# Patient Record
Sex: Male | Born: 1990 | Race: White | Hispanic: No | Marital: Single | Smoking: Never smoker
Health system: Southern US, Community
[De-identification: ages and names within clinical notes are randomized; demographics above are authoritative.]

## PROBLEM LIST (undated history)

## (undated) DIAGNOSIS — F419 Anxiety disorder, unspecified: Secondary | ICD-10-CM

## (undated) DIAGNOSIS — K219 Gastro-esophageal reflux disease without esophagitis: Secondary | ICD-10-CM

---

## 2015-03-14 ENCOUNTER — Emergency Department
Admission: EM | Admit: 2015-03-14 | Discharge: 2015-03-14 | Disposition: A | Discharge status: Home / Self Care | Payer: Worker's Compensation | Attending: Emergency Medicine | Admitting: Emergency Medicine

## 2015-03-14 ENCOUNTER — Encounter: Payer: Self-pay | Admitting: Emergency Medicine

## 2015-03-14 DIAGNOSIS — Y288XXA Contact with other sharp object, undetermined intent, initial encounter: Secondary | ICD-10-CM | POA: Diagnosis not present

## 2015-03-14 DIAGNOSIS — Y9389 Activity, other specified: Secondary | ICD-10-CM | POA: Diagnosis not present

## 2015-03-14 DIAGNOSIS — Y998 Other external cause status: Secondary | ICD-10-CM | POA: Insufficient documentation

## 2015-03-14 DIAGNOSIS — Y9289 Other specified places as the place of occurrence of the external cause: Secondary | ICD-10-CM | POA: Insufficient documentation

## 2015-03-14 DIAGNOSIS — S61011A Laceration without foreign body of right thumb without damage to nail, initial encounter: Secondary | ICD-10-CM | POA: Insufficient documentation

## 2015-03-14 NOTE — Discharge Instructions (Signed)
Laceration Care, Adult  A laceration is a cut that goes through all layers of the skin. The cut also goes into the tissue that is right under the skin. Some cuts heal on their own. Others need to be closed with stitches (sutures), staples, skin adhesive strips, or wound glue. Taking care of your cut lowers your risk of infection and helps your cut to heal better.  HOW TO TAKE CARE OF YOUR CUT  For stitches or staples:  · Keep the wound clean and dry.  · If you were given a bandage (dressing), you should change it at least one time per day or as told by your doctor. You should also change it if it gets wet or dirty.  · Keep the wound completely dry for the first 24 hours or as told by your doctor. After that time, you may take a shower or a bath. However, make sure that the wound is not soaked in water until after the stitches or staples have been removed.  · Clean the wound one time each day or as told by your doctor:    Wash the wound with soap and water.    Rinse the wound with water until all of the soap comes off.    Pat the wound dry with a clean towel. Do not rub the wound.  · After you clean the wound, put a thin layer of antibiotic ointment on it as told by your doctor. This ointment:    Helps to prevent infection.    Keeps the bandage from sticking to the wound.  · Have your stitches or staples removed as told by your doctor.  If your doctor used skin adhesive strips:   · Keep the wound clean and dry.  · If you were given a bandage, you should change it at least one time per day or as told by your doctor. You should also change it if it gets dirty or wet.  · Do not get the skin adhesive strips wet. You can take a shower or a bath, but be careful to keep the wound dry.  · If the wound gets wet, pat it dry with a clean towel. Do not rub the wound.  · Skin adhesive strips fall off on their own. You can trim the strips as the wound heals. Do not remove any strips that are still stuck to the wound. They will  fall off after a while.  If your doctor used wound glue:  · Try to keep your wound dry, but you may briefly wet it in the shower or bath. Do not soak the wound in water, such as by swimming.  · After you take a shower or a bath, gently pat the wound dry with a clean towel. Do not rub the wound.  · Do not do any activities that will make you really sweaty until the skin glue has fallen off on its own.  · Do not apply liquid, cream, or ointment medicine to your wound while the skin glue is still on.  · If you were given a bandage, you should change it at least one time per day or as told by your doctor. You should also change it if it gets dirty or wet.  · If a bandage is placed over the wound, do not let the tape for the bandage touch the skin glue.  · Do not pick at the glue. The skin glue usually stays on for 5-10 days. Then, it   or when wound glue stays in place and the wound is healed. Make sure to wear a sunscreen of at least 30 SPF.  Take over-the-counter and prescription medicines only as told by your doctor.  If you were given antibiotic medicine or ointment, take or apply it as told by your doctor. Do not stop using the antibiotic even if your wound is getting better.  Do not scratch or pick at the wound.  Keep all follow-up visits as told by your doctor. This is important.  Check your wound every day for signs of infection. Watch for:  Redness, swelling, or pain.  Fluid, blood, or pus.  Raise (elevate) the injured area above the level of your heart while you are sitting or lying down, if possible. GET HELP IF:  You got a tetanus shot and you have any of these problems at the injection site:  Swelling.  Very bad pain.  Redness.  Bleeding.  You have a fever.  A wound that was  closed breaks open.  You notice a bad smell coming from your wound or your bandage.  You notice something coming out of the wound, such as wood or glass.  Medicine does not help your pain.  You have more redness, swelling, or pain at the site of your wound.  You have fluid, blood, or pus coming from your wound.  You notice a change in the color of your skin near your wound.  You need to change the bandage often because fluid, blood, or pus is coming from the wound.  You start to have a new rash.  You start to have numbness around the wound. GET HELP RIGHT AWAY IF:  You have very bad swelling around the wound.  Your pain suddenly gets worse and is very bad.  You notice painful lumps near the wound or on skin that is anywhere on your body.  You have a red streak going away from your wound.  The wound is on your hand or foot and you cannot move a finger or toe like you usually can.  The wound is on your hand or foot and you notice that your fingers or toes look pale or bluish.   This information is not intended to replace advice given to you by your health care provider. Make sure you discuss any questions you have with your health care provider.   Document Released: 09/07/2007 Document Revised: 08/05/2014 Document Reviewed: 03/17/2014 Elsevier Interactive Patient Education 2016 [employer]  Nonsutured Laceration Care A laceration is a cut that goes through all layers of the skin and extends into the tissue that is right under the skin. This type of cut is usually stitched up (sutured) or closed with tape (adhesive strips) or skin glue shortly after the injury happens. However, if the wound is dirty or if several hours pass before medical treatment is provided, it is likely that germs (bacteria) will enter the wound. Closing a laceration after bacteria have entered it increases the risk of infection. In these cases, your health care provider may leave the laceration open  (nonsutured) and cover it with a bandage. This type of treatment helps prevent infection and allows the wound to heal from the deepest layer of tissue damage up to the surface. An open fracture is a type of injury that may involve nonsutured lacerations. An open fracture is a break in a bone that happens along with one or more lacerations through the skin that is near the fracture site. HOW  TO CARE FOR YOUR NONSUTURED LACERATION  Take or apply over-the-counter and prescription medicines only as told by your health care provider.  If you were prescribed an antibiotic medicine, take or apply it as told by your health care provider. Do not stop using the antibiotic even if your condition improves.  Clean the wound one time each day or as told by your health care provider.  Wash the wound with mild soap and water.  Rinse the wound with water to remove all soap.  Pat your wound dry with a clean towel. Do not rub the wound.  Do not inject anything into the wound unless your health care provider told you to.  Change any bandages (dressings) as told by your health care provider. This includes changing the dressing if it gets wet, dirty, or starts to smell bad.  Keep the dressing dry until your health care provider says it can be removed. Do not take baths, swim, or do anything that puts your wound underwater until your health care provider approves.  Raise (elevate) the injured area above the level of your heart while you are sitting or lying down, if possible.  Do not scratch or pick at the wound.  Check your wound every day for signs of infection. Watch for:  Redness, swelling, or pain.  Fluid, blood, or pus.  Keep all follow-up visits as told by your health care provider. This is important. SEEK MEDICAL CARE IF:  You received a tetanus and shot and you have swelling, severe pain, redness, or bleeding at the injection site.   You have a fever.  Your pain is not controlled with  medicine.  You have increased redness, swelling, or pain at the site of your wound.  You have fluid, blood, or pus coming from your wound.  You notice a bad smell coming from your wound or your dressing.  You notice something coming out of the wound, such as wood or glass.  You notice a change in the color of your skin near your wound.  You develop a new rash.  You need to change the dressing frequently due to fluid, blood, or pus draining from the wound.  You develop numbness around your wound. SEEK IMMEDIATE MEDICAL CARE IF:  Your pain suddenly increases and is severe.  You develop severe swelling around the wound.  The wound is on your hand or foot and you cannot properly move a finger or toe.  The wound is on your hand or foot and you notice that your fingers or toes look pale or bluish.  You have a red streak going away from your wound.   This information is not intended to replace advice given to you by your health care provider. Make sure you discuss any questions you have with your health care provider.   Document Released: 02/16/2006 Document Revised: 08/05/2014 Document Reviewed: 03/17/2014 Elsevier Interactive Patient Education 2016 [employer]   Keep the wound/bandage clean and dry. Wear gloves at work for protection. [facility] or your company's provider.

## 2015-03-14 NOTE — ED Notes (Signed)
Pt states lacerated left thumb on knife at 1700. Dressing in place.

## 2015-03-14 NOTE — ED Notes (Signed)
Pt discharged home after verbalizing understanding of discharge instructions; nad noted. 

## 2015-03-14 NOTE — ED Notes (Signed)
Pt presents from work with laceration to left thumb that he has glued. He states he cleaned it with alcohol pad and then glued it. Pt current on tetanus. Denies pain.

## 2015-03-14 NOTE — ED Provider Notes (Signed)
[facility] Emergency Department Provider Note ____________________________________________  Time seen: 2215  I have reviewed the triage vital signs and the nursing notes.  HISTORY  Chief Complaint  Laceration  HPI Jorge Ballard is a 24 y.o. male ports to the ED from work with a laceration to his left thumb. 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 Bleeding is currently controlled as he later washed the area and presents here for treatment. Bleeding is currently controlled and he reports that he has cleaned the area with alcohol and then applied glue to prior to arrival. He reports a current tetanus status and denies any other injury at this time.  History reviewed. No pertinent past medical history.  There are no active problems to display for this patient.  History reviewed. No pertinent past surgical history.  No current outpatient prescriptions on file.  Allergies Review of patient's allergies indicates no known allergies.  History reviewed. No pertinent family history.  Social History Social History  Substance Use Topics  . Smoking status: Never Smoker   . Smokeless tobacco: None  . Alcohol Use: No   Review of Systems  Constitutional: Negative for fever. Eyes: Negative for visual changes. ENT: Negative for sore throat. Cardiovascular: Negative for chest pain. Respiratory: Negative for shortness of breath. Gastrointestinal: Negative for abdominal pain, vomiting and diarrhea. Genitourinary: Negative for dysuria. Musculoskeletal: Negative for back pain. Skin: Negative for rash. Right thumb laceration as above. Neurological: Negative for headaches, focal weakness or numbness. ____________________________________________  PHYSICAL EXAM:  VITAL SIGNS: ED Triage Vitals  Enc Vitals Group     BP 03/14/15 2051 129/86 mmHg     Pulse Rate 03/14/15 2051 67     Resp 03/14/15 2051 14     Temp 03/14/15 2051 97.9 F (36.6 C)     Temp Source 03/14/15 2051 Oral     SpO2 03/14/15 2051 100 %     Weight 03/14/15 2051 155 lb (70.308 kg)     Height 03/14/15 2051  (1.88 m)     Head Cir --      Peak Flow --      Pain Score 03/14/15 2051 2     Pain Loc --      Pain Edu? --      Excl. in GC? --    Constitutional: Alert and oriented. Well appearing and in no distress. Head: Normocephalic and atraumatic.      Eyes: Conjunctivae are normal. PERRL. Normal extraocular movements      Ears: Canals clear. TMs intact bilaterally.   Nose: No congestion/rhinorrhea.   Mouth/Throat: Mucous membranes are moist.   Neck: Supple. No thyromegaly. Hematological/Lymphatic/Immunological: No cervical lymphadenopathy. Cardiovascular: Normal rate, regular rhythm.  Respiratory: Normal respiratory effort. No wheezes/rales/rhonchi. Gastrointestinal: Soft and nontender. No distention. Musculoskeletal: Nontender with normal range of motion in all extremities.  Neurologic:  Normal gait without ataxia. Normal speech and language. No gross focal neurologic deficits are appreciated. Skin:  Skin is warm, dry and intact. No rash noted. The right thumb is noted to have a laceration to the medial aspect at the distal thumb that extends into the nail bed. The small flap is well adhered with no active bleeding currently. Psychiatric: Mood and affect are normal. Patient exhibits appropriate insight and judgment. ____________________________________________  PROCEDURES  Wound dressing applied ____________________________________________  INITIAL IMPRESSION / ASSESSMENT AND PLAN / ED COURSE  Patient with a superficial flap laceration to the left  thumb without suture repair at this time. Patient has essentially applied glue to the wound prior to arrival and there is no indication for suture repair. He is discharged with wound care instructions, and food service precautions including keeping the wound  clean, covered, and dry. He will follow with his primary care provider or his companies Worker's Comp. provider as needed. ____________________________________________  FINAL CLINICAL IMPRESSION(S) / ED DIAGNOSES  Final diagnoses:  Thumb laceration, right, initial encounter      Lissa HoardJenise V Bacon Eustacia Urbanek, PA-C 03/14/15 2250  Jennye MoccasinBrian S Quigley, MD 03/14/15 2253

## 2019-03-12 ENCOUNTER — Other Ambulatory Visit: Payer: Self-pay

## 2019-03-12 DIAGNOSIS — Z20822 Contact with and (suspected) exposure to covid-19: Secondary | ICD-10-CM

## 2019-03-13 LAB — NOVEL CORONAVIRUS, NAA: SARS-CoV-2, NAA: NOT DETECTED

## 2019-03-17 ENCOUNTER — Emergency Department: Payer: BLUE CROSS/BLUE SHIELD

## 2019-03-17 ENCOUNTER — Other Ambulatory Visit: Payer: Self-pay

## 2019-03-17 ENCOUNTER — Encounter: Payer: Self-pay | Admitting: Emergency Medicine

## 2019-03-17 ENCOUNTER — Observation Stay
Admission: EM | Admit: 2019-03-17 | Discharge: 2019-03-18 | Disposition: A | Discharge status: Home / Self Care | Payer: BLUE CROSS/BLUE SHIELD | Attending: Surgery | Admitting: Surgery

## 2019-03-17 DIAGNOSIS — S0081XA Abrasion of other part of head, initial encounter: Secondary | ICD-10-CM | POA: Diagnosis not present

## 2019-03-17 DIAGNOSIS — R1031 Right lower quadrant pain: Secondary | ICD-10-CM | POA: Diagnosis present

## 2019-03-17 DIAGNOSIS — Z79899 Other long term (current) drug therapy: Secondary | ICD-10-CM | POA: Diagnosis not present

## 2019-03-17 DIAGNOSIS — R3129 Other microscopic hematuria: Secondary | ICD-10-CM | POA: Insufficient documentation

## 2019-03-17 DIAGNOSIS — S37019A Minor contusion of unspecified kidney, initial encounter: Secondary | ICD-10-CM | POA: Diagnosis not present

## 2019-03-17 DIAGNOSIS — Z20828 Contact with and (suspected) exposure to other viral communicable diseases: Secondary | ICD-10-CM | POA: Insufficient documentation

## 2019-03-17 LAB — URINALYSIS, ROUTINE W REFLEX MICROSCOPIC
Bilirubin Urine: NEGATIVE
Glucose, UA: NEGATIVE mg/dL
Ketones, ur: NEGATIVE mg/dL
Leukocytes,Ua: NEGATIVE
Nitrite: NEGATIVE
Protein, ur: 30 mg/dL — AB
RBC / HPF: >50 RBC/hpf — ABNORMAL HIGH (ref 0–5)
Specific Gravity, Urine: 1.023 (ref 1.005–1.030)
Squamous Epithelial / LPF: NONE SEEN (ref 0–5)
pH: 5 (ref 5.0–8.0)

## 2019-03-17 LAB — CBC WITH DIFFERENTIAL/PLATELET
Abs Immature Granulocytes: 0.04 10*3/uL (ref 0.00–0.07)
Basophils Absolute: 0 10*3/uL (ref 0.0–0.1)
Basophils Relative: 0 %
Eosinophils Absolute: 0 10*3/uL (ref 0.0–0.5)
Eosinophils Relative: 0 %
HCT: 40.5 % (ref 39.0–52.0)
Hemoglobin: 14.9 g/dL (ref 13.0–17.0)
Immature Granulocytes: 1 %
Lymphocytes Relative: 16 %
Lymphs Abs: 1.4 10*3/uL (ref 0.7–4.0)
MCH: 30 pg (ref 26.0–34.0)
MCHC: 36.8 g/dL — ABNORMAL HIGH (ref 30.0–36.0)
MCV: 81.7 fL (ref 80.0–100.0)
Monocytes Absolute: 0.4 10*3/uL (ref 0.1–1.0)
Monocytes Relative: 5 %
Neutro Abs: 6.8 10*3/uL (ref 1.7–7.7)
Neutrophils Relative %: 78 %
Platelets: 237 10*3/uL (ref 150–400)
RBC: 4.96 MIL/uL (ref 4.22–5.81)
RDW: 11.7 % (ref 11.5–15.5)
WBC: 8.7 10*3/uL (ref 4.0–10.5)
nRBC: 0 % (ref 0.0–0.2)

## 2019-03-17 LAB — BASIC METABOLIC PANEL
Anion gap: 8 (ref 5–15)
BUN: 12 mg/dL (ref 6–20)
CO2: 27 mmol/L (ref 22–32)
Calcium: 9.2 mg/dL (ref 8.9–10.3)
Chloride: 104 mmol/L (ref 98–111)
Creatinine, Ser: 1.15 mg/dL (ref 0.61–1.24)
GFR calc Af Amer: >60 mL/min (ref >60)
GFR calc non Af Amer: >60 mL/min (ref >60)
Glucose, Bld: 101 mg/dL — ABNORMAL HIGH (ref 70–99)
Potassium: 4.3 mmol/L (ref 3.5–5.1)
Sodium: 139 mmol/L (ref 135–145)

## 2019-03-17 MED ORDER — DIPHENHYDRAMINE HCL 25 MG PO CAPS: 25.0000 mg | ORAL_CAPSULE | Freq: Four times a day (QID) | ORAL | Status: DC | PRN

## 2019-03-17 MED ORDER — ACETAMINOPHEN 325 MG PO TABS: 650.0000 mg | ORAL_TABLET | Freq: Four times a day (QID) | ORAL | Status: DC | PRN

## 2019-03-17 MED ORDER — MORPHINE SULFATE (PF) 2 MG/ML IV SOLN
2.0000 mg | INTRAVENOUS | Status: DC | PRN
Start: 2019-03-17 — End: 2019-03-18

## 2019-03-17 MED ORDER — ONDANSETRON 4 MG PO TBDP: 4.0000 mg | ORAL_TABLET | Freq: Four times a day (QID) | ORAL | Status: DC | PRN

## 2019-03-17 MED ORDER — HYDROCODONE-ACETAMINOPHEN 5-325 MG PO TABS
1.0000 | ORAL_TABLET | ORAL | Status: DC | PRN
  Administered 2019-03-17: 1 via ORAL
  Filled 2019-03-17: qty 1

## 2019-03-17 MED ORDER — IOHEXOL 300 MG/ML  SOLN
100.0000 mL | Freq: Once | INTRAMUSCULAR | Status: AC | PRN
  Administered 2019-03-17: 100 mL via INTRAVENOUS
  Filled 2019-03-17: qty 100

## 2019-03-17 MED ORDER — ACETAMINOPHEN 650 MG RE SUPP: 650.0000 mg | Freq: Four times a day (QID) | RECTAL | Status: DC | PRN

## 2019-03-17 MED ORDER — ONDANSETRON HCL 4 MG/2ML IJ SOLN: 4.0000 mg | Freq: Four times a day (QID) | INTRAMUSCULAR | Status: DC | PRN

## 2019-03-17 MED ORDER — LORAZEPAM 1 MG PO TABS
1.0000 mg | ORAL_TABLET | Freq: Once | ORAL | Status: AC
  Administered 2019-03-17: 1 mg via ORAL
  Filled 2019-03-17: qty 1

## 2019-03-17 MED ORDER — DIPHENHYDRAMINE HCL 50 MG/ML IJ SOLN: 25.0000 mg | Freq: Four times a day (QID) | INTRAMUSCULAR | Status: DC | PRN
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
## 2019-03-17 NOTE — ED Notes (Signed)
Pt reports decreased pain to ribs, but gradually increasing headache pain. Pt describes head/neck pain as "intermittently stabbing"

## 2019-03-17 NOTE — ED Notes (Signed)
Pt provided with apple juice, CLIQ diet explanation provided to pt who verbalizes understanding.
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
## 2019-03-17 NOTE — ED Notes (Signed)
Report from ariel, rn.  

## 2019-03-17 NOTE — ED Notes (Signed)
Pt provided with incentive spirometry and demonstrates proper compliance.

## 2019-03-17 NOTE — ED Notes (Signed)
Report given to Noah,RN.

## 2019-03-18 DIAGNOSIS — R1031 Right lower quadrant pain: Secondary | ICD-10-CM

## 2019-03-18 LAB — BASIC METABOLIC PANEL
Anion gap: 8 (ref 5–15)
BUN: 11 mg/dL (ref 6–20)
CO2: 28 mmol/L (ref 22–32)
Calcium: 8.9 mg/dL (ref 8.9–10.3)
Chloride: 104 mmol/L (ref 98–111)
Creatinine, Ser: 1.07 mg/dL (ref 0.61–1.24)
GFR calc Af Amer: >60 mL/min (ref >60)
GFR calc non Af Amer: >60 mL/min (ref >60)
Glucose, Bld: 76 mg/dL (ref 70–99)
Potassium: 3.4 mmol/L — ABNORMAL LOW (ref 3.5–5.1)
Sodium: 140 mmol/L (ref 135–145)

## 2019-03-18 LAB — CBC
HCT: 37.9 % — ABNORMAL LOW (ref 39.0–52.0)
Hemoglobin: 14.1 g/dL (ref 13.0–17.0)
MCH: 30.1 pg (ref 26.0–34.0)
MCHC: 37.2 g/dL — ABNORMAL HIGH (ref 30.0–36.0)
MCV: 81 fL (ref 80.0–100.0)
Platelets: 215 10*3/uL (ref 150–400)
RBC: 4.68 MIL/uL (ref 4.22–5.81)
RDW: 11.6 % (ref 11.5–15.5)
WBC: 5.6 10*3/uL (ref 4.0–10.5)
nRBC: 0 % (ref 0.0–0.2)

## 2019-03-18 LAB — SARS CORONAVIRUS 2 (TAT 6-24 HRS): SARS Coronavirus 2: NEGATIVE

## 2019-03-18 MED ORDER — IBUPROFEN 800 MG PO TABS: 800.0000 mg | ORAL_TABLET | Freq: Three times a day (TID) | ORAL | 0 refills | Status: DC | PRN
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
## 2019-05-26 ENCOUNTER — Emergency Department: Payer: No Typology Code available for payment source

## 2019-05-26 ENCOUNTER — Encounter: Payer: Self-pay | Admitting: Emergency Medicine

## 2019-05-26 ENCOUNTER — Emergency Department
Admission: EM | Admit: 2019-05-26 | Discharge: 2019-05-26 | Disposition: A | Discharge status: Home / Self Care | Payer: No Typology Code available for payment source | Attending: Emergency Medicine | Admitting: Emergency Medicine

## 2019-05-26 ENCOUNTER — Other Ambulatory Visit: Payer: Self-pay

## 2019-05-26 DIAGNOSIS — R110 Nausea: Secondary | ICD-10-CM | POA: Diagnosis not present

## 2019-05-26 DIAGNOSIS — K529 Noninfective gastroenteritis and colitis, unspecified: Secondary | ICD-10-CM | POA: Insufficient documentation

## 2019-05-26 DIAGNOSIS — Z79899 Other long term (current) drug therapy: Secondary | ICD-10-CM | POA: Insufficient documentation

## 2019-05-26 DIAGNOSIS — R109 Unspecified abdominal pain: Secondary | ICD-10-CM | POA: Diagnosis present

## 2019-05-26 DIAGNOSIS — F17220 Nicotine dependence, chewing tobacco, uncomplicated: Secondary | ICD-10-CM | POA: Insufficient documentation

## 2019-05-26 DIAGNOSIS — R42 Dizziness and giddiness: Secondary | ICD-10-CM | POA: Diagnosis not present

## 2019-05-26 HISTORY — DX: Gastro-esophageal reflux disease without esophagitis: K21.9

## 2019-05-26 HISTORY — DX: Anxiety disorder, unspecified: F41.9

## 2019-05-26 LAB — COMPREHENSIVE METABOLIC PANEL
ALT: 14 U/L (ref 0–44)
AST: 16 U/L (ref 15–41)
Albumin: 4.6 g/dL (ref 3.5–5.0)
Alkaline Phosphatase: 57 U/L (ref 38–126)
Anion gap: 10 (ref 5–15)
BUN: 11 mg/dL (ref 6–20)
CO2: 27 mmol/L (ref 22–32)
Calcium: 9.5 mg/dL (ref 8.9–10.3)
Chloride: 103 mmol/L (ref 98–111)
Creatinine, Ser: 0.94 mg/dL (ref 0.61–1.24)
GFR calc Af Amer: >60 mL/min (ref >60)
GFR calc non Af Amer: >60 mL/min (ref >60)
Glucose, Bld: 93 mg/dL (ref 70–99)
Potassium: 3.5 mmol/L (ref 3.5–5.1)
Sodium: 140 mmol/L (ref 135–145)
Total Bilirubin: 1.3 mg/dL — ABNORMAL HIGH (ref 0.3–1.2)
Total Protein: 7.6 g/dL (ref 6.5–8.1)

## 2019-05-26 LAB — CBC
HCT: 44.8 % (ref 39.0–52.0)
Hemoglobin: 16.2 g/dL (ref 13.0–17.0)
MCH: 30.4 pg (ref 26.0–34.0)
MCHC: 36.2 g/dL — ABNORMAL HIGH (ref 30.0–36.0)
MCV: 84.1 fL (ref 80.0–100.0)
Platelets: 241 10*3/uL (ref 150–400)
RBC: 5.33 MIL/uL (ref 4.22–5.81)
RDW: 11.8 % (ref 11.5–15.5)
WBC: 5.6 10*3/uL (ref 4.0–10.5)
nRBC: 0 % (ref 0.0–0.2)

## 2019-05-26 LAB — LIPASE, BLOOD: Lipase: 35 U/L (ref 11–51)

## 2019-05-26 MED ORDER — TRAMADOL HCL 50 MG PO TABS: 50.0000 mg | ORAL_TABLET | Freq: Four times a day (QID) | ORAL | 0 refills | Status: AC | PRN

## 2019-05-26 MED ORDER — SODIUM CHLORIDE 0.9 % IV BOLUS
1000.0000 mL | Freq: Once | INTRAVENOUS | Status: AC
  Administered 2019-05-26: 1000 mL via INTRAVENOUS

## 2019-05-26 MED ORDER — ONDANSETRON HCL 4 MG/2ML IJ SOLN
4.0000 mg | Freq: Once | INTRAMUSCULAR | Status: AC
  Administered 2019-05-26: 4 mg via INTRAVENOUS
  Filled 2019-05-26: qty 2

## 2019-05-26 MED ORDER — MORPHINE SULFATE (PF) 4 MG/ML IV SOLN
4.0000 mg | Freq: Once | INTRAVENOUS | Status: AC
  Administered 2019-05-26: 4 mg via INTRAVENOUS
  Filled 2019-05-26: qty 1

## 2019-05-26 MED ORDER — SODIUM CHLORIDE 0.9% FLUSH: 3.0000 mL | Freq: Once | INTRAVENOUS | Status: DC

## 2019-05-26 MED ORDER — ONDANSETRON 4 MG PO TBDP: 4.0000 mg | ORAL_TABLET | Freq: Three times a day (TID) | ORAL | 0 refills | Status: AC | PRN

## 2019-05-26 MED ORDER — ALUM & MAG HYDROXIDE-SIMETH 400-400-40 MG/5ML PO SUSP: 5.0000 mL | Freq: Four times a day (QID) | ORAL | 0 refills | Status: AC | PRN

## 2019-05-26 MED ORDER — IOHEXOL 300 MG/ML  SOLN
100.0000 mL | Freq: Once | INTRAMUSCULAR | Status: AC | PRN
  Administered 2019-05-26: 100 mL via INTRAVENOUS

## 2019-05-26 NOTE — ED Triage Notes (Signed)
Patient ambulatory to triage via POV (pt alone) with complaints of generalized abdominal pain with nausea 11 days, tonight pain is worse in the left ribs - pressure with intermittent throbbing 8 to 9/10.  Pt also c/o weight loss and dizziness as well.  Pt reports hx GERD and Anxiety (fluoxetine)  Pt reports concern for an ulcer   Speaking in complete coherent sentences. No acute breathing distress noted.

## 2019-05-26 NOTE — ED Provider Notes (Signed)
[facility] Emergency Department Provider Note  ____________________________________________  Time seen: Approximately 5:33 AM  I have reviewed the triage vital signs and the nursing notes.   HISTORY  Chief Complaint Abdominal Pain   HPI Jorge Ballard is a 29 y.o. male with a history of GERD, IBS, anxiety who presents for evaluation of abdominal pain.  Patient reports 10 to 14 days of left-sided squeezing abdominal pain which has been pretty much constant.  The pain is worse and associated with nausea after he eats.  He therefore has had no appetite and decreased p.o. intake.  He endorses 20 pound weight loss over the same amount of time.  He reports that this evening the pain was severe.  He has an appointment coming up this week with a GI doctor.  He denies any prior abdominal surgeries.  Patient is concerned that he might have an ulcer.  He denies alcohol use or NSAID use.  Is also complaining of dizziness associated with his symptoms.   No diarrhea or constipation, no chest pain, no shortness of breath, no cough or fever, no dysuria or hematuria.  Past Medical History:  Diagnosis Date  . Anxiety   . GERD (gastroesophageal reflux disease)     Patient Active Problem List   Diagnosis Date Noted  . Renal contusion 03/17/2019    History reviewed. No pertinent surgical history.  Prior to Admission medications   Medication Sig Start Date End Date Taking? Authorizing Provider  cyclobenzaprine (FLEXERIL) 5 MG tablet Take 5 mg by mouth at bedtime as needed for muscle spasms. 04/09/19  Yes [provider]  FLUoxetine (PROZAC) 10 MG capsule Take 10 mg by mouth daily. 04/09/19  Yes [provider]  LORAZEPAM PO Take 1 tablet by mouth as needed (anxiety).   Yes [provider]  pantoprazole (PROTONIX) 40 MG tablet Take 40 mg by mouth daily.   Yes [provider]  SUMAtriptan (IMITREX) 50 MG tablet Take 50 mg by mouth every 2  (two) hours as needed for migraine. May repeat in 2 hours if headache persists or recurs.   Yes [provider]  alum & mag hydroxide-simeth (MAALOX MAX) 400-400-40 MG/5ML suspension Take 5 mLs by mouth every 6 (six) hours as needed for indigestion. 05/26/19   Nita Sickle, MD  ondansetron (ZOFRAN ODT) 4 MG disintegrating tablet Take 1 tablet (4 mg total) by mouth every 8 (eight) hours as needed. 05/26/19   Nita Sickle, MD  traMADol (ULTRAM) 50 MG tablet Take 1 tablet (50 mg total) by mouth every 6 (six) hours as needed. 05/26/19 05/25/20  Nita Sickle, MD    Allergies Patient has no known allergies.  History reviewed. No pertinent family history.  Social History Social History   Tobacco Use  . Smoking status: Never Smoker  . Smokeless tobacco: Current User    Types: Chew  Substance Use Topics  . Alcohol use: No  . Drug use: Never    Review of Systems  Constitutional: Negative for fever. Eyes: Negative for visual changes. ENT: Negative for sore throat. Neck: No neck pain  Cardiovascular: Negative for chest pain. Respiratory: Negative for shortness of breath. Gastrointestinal: + left sided abdominal pain and nausea. No vomiting or diarrhea. Genitourinary: Negative for dysuria. Musculoskeletal: Negative for back pain. Skin: Negative for rash. Neurological: Negative for headaches, weakness or numbness. Psych: No SI or HI  ____________________________________________   PHYSICAL EXAM:  VITAL SIGNS: ED Triage Vitals  Enc Vitals Group  BP 05/26/19 0416 134/81     Pulse Rate 05/26/19 0416 81     Resp 05/26/19 0416 16     Temp 05/26/19 0416 98 F (36.7 C)     Temp Source 05/26/19 0416 Oral     SpO2 05/26/19 0416 100 %     Weight 05/26/19 0417 145 lb (65.8 kg)     Height 05/26/19 0417 6\' 2"  (1.88 m)     Head Circumference --      Peak Flow --      Pain Score 05/26/19 0417 8     Pain Loc --      Pain Edu? --      Excl. in GC? --      Constitutional: Alert and oriented. Well appearing and in no apparent distress. HEENT:      Head: Normocephalic and atraumatic.         Eyes: Conjunctivae are normal. Sclera is non-icteric.       Mouth/Throat: Mucous membranes are moist.       Neck: Supple with no signs of meningismus. Cardiovascular: Regular rate and rhythm. No murmurs, gallops, or rubs. 2+ symmetrical distal pulses are present in all extremities. No JVD. Respiratory: Normal respiratory effort. Lungs are clear to auscultation bilaterally. No wheezes, crackles, or rhonchi.  Gastrointestinal: Soft, mild diffuse tenderness on the left quadrants, and non distended with positive bowel sounds. No rebound or guarding. Genitourinary: No CVA tenderness. Musculoskeletal: Nontender with normal range of motion in all extremities. No edema, cyanosis, or erythema of extremities. Neurologic: Normal speech and language. Face is symmetric. Moving all extremities. No gross focal neurologic deficits are appreciated. Skin: Skin is warm, dry and intact. No rash noted. Psychiatric: Mood and affect are normal. Speech and behavior are normal.  ____________________________________________   LABS (all labs ordered are listed, but only abnormal results are displayed)  Labs Reviewed  COMPREHENSIVE METABOLIC PANEL - Abnormal; Notable for the following components:      Result Value   Total Bilirubin 1.3 (*)    All other components within normal limits  CBC - Abnormal; Notable for the following components:   MCHC 36.2 (*)    All other components within normal limits  LIPASE, BLOOD  URINALYSIS, COMPLETE (UACMP) WITH MICROSCOPIC   ____________________________________________  EKG  none  ____________________________________________  RADIOLOGY  I have personally reviewed the images performed during this visit and I agree with the Radiologist's read.   Interpretation by Radiologist:  CT ABDOMEN PELVIS W CONTRAST  Result Date:  05/26/2019 CLINICAL DATA:  Generalized abdominal pain and nausea. EXAM: CT ABDOMEN AND PELVIS WITH CONTRAST TECHNIQUE: Multidetector CT imaging of the abdomen and pelvis was performed using the standard protocol following bolus administration of intravenous contrast. CONTRAST:  05/28/2019 OMNIPAQUE IOHEXOL 300 MG/ML  SOLN COMPARISON:  03/17/2019 CT abdomen/pelvis. FINDINGS: Lower chest: No significant pulmonary nodules or acute consolidative airspace disease. Hepatobiliary: Normal liver size. No liver mass. Normal gallbladder with no radiopaque cholelithiasis. No biliary ductal dilatation. Pancreas: Normal, with no mass or duct dilation. Spleen: Normal size. No mass. Adrenals/Urinary Tract: Normal adrenals. Normal kidneys with no hydronephrosis and no renal mass. Normal bladder. Stomach/Bowel: Normal non-distended stomach. Mildly dilated jejunal loops in the left upper quadrant up to 3.3 cm diameter. Nondistended mid to distal small bowel. No discrete small bowel caliber transition. No small bowel wall thickening. Normal appendix. Normal large bowel with no diverticulosis, large bowel wall thickening or pericolonic fat stranding. Vascular/Lymphatic: Normal caliber abdominal aorta. Patent portal, splenic, hepatic and  renal veins. No pathologically enlarged lymph nodes in the abdomen or pelvis. Reproductive: Normal size prostate. Other: No pneumoperitoneum, ascites or focal fluid collection. Musculoskeletal: No aggressive appearing focal osseous lesions. Mild degenerative disc disease at L5-S1. IMPRESSION: Nonspecific mildly dilated jejunal loops in the left upper quadrant, without discrete small bowel caliber transition. No bowel wall thickening. Findings are most suggestive of a mild adynamic ileus such as due to a mild enteritis. Electronically Signed   By: Ilona Sorrel M.D.   On: 05/26/2019 06:04      ____________________________________________   PROCEDURES  Procedure(s) performed: None Procedures Critical  Care performed:  None ____________________________________________   INITIAL IMPRESSION / ASSESSMENT AND PLAN / ED COURSE  29 y.o. male with a history of GERD, IBS, anxiety who presents for evaluation of left sided abdominal pain, nausea, and 20-lb weight loss over the course of 10-14 days.  Patient is well-appearing and in no distress with normal vital signs, abdomen is soft with mild diffuse left-sided tenderness, no rebound or guarding, no palpable masses.  Differential diagnosis including IBS flare versus peptic ulcer disease versus pancreatitis versus gallbladder disease versus diverticulitis versus IBD.  Plan for CBC, CMP, lipase, urinalysis, CT abdomen pelvis.  Will give morphine and Zofran for symptom relief.    _________________________ 6:15 AM on 05/26/2019 -----------------------------------------  Labs with no acute findings.  CT showing nonspecific mildly dilated jejunal loops in the left upper quadrant without signs of obstruction.  Possible enteritis versus ulcer versus IBS.  Patient feels better and is tolerating p.o.  Will discharge home on Zofran, Maalox, extra strength Tylenol, short course of tramadol.  Discussed the importance of keeping his upcoming appointment with his GI doctor for further evaluation.  Discussed my standard return precautions.    As part of my medical decision making, I reviewed the following data within the [place] notes reviewed and incorporated, Labs reviewed , Old chart reviewed, Radiograph reviewed , Notes from prior ED visits and [place] Controlled Substance Database   Please note:  Patient was evaluated in Emergency Department today for the symptoms described in the history of present illness. Patient was evaluated in the context of the global COVID-19 pandemic, which necessitated consideration that the patient might be at risk for infection with the SARS-CoV-2 virus that causes COVID-19. Institutional protocols and  algorithms that pertain to the evaluation of patients at risk for COVID-19 are in a state of rapid change based on information released by regulatory bodies including the CDC and federal and state organizations. These policies and algorithms were followed during the patient's care in the ED.  Some ED evaluations and interventions may be delayed as a result of limited staffing during the pandemic.   ____________________________________________   FINAL CLINICAL IMPRESSION(S) / ED DIAGNOSES   Final diagnoses:  Enteritis      NEW MEDICATIONS STARTED DURING THIS VISIT:  ED Discharge Orders         Ordered    ondansetron (ZOFRAN ODT) 4 MG disintegrating tablet  Every 8 hours PRN     05/26/19 0610    alum & mag hydroxide-simeth (MAALOX MAX) 562-130-86 MG/5ML suspension  Every 6 hours PRN     05/26/19 0610    traMADol (ULTRAM) 50 MG tablet  Every 6 hours PRN     05/26/19 0610           Note:  This document was prepared using Dragon voice recognition software and may include unintentional dictation errors.    Alfred Levins,  [place], MD 05/26/19 [phone]

## 2019-05-26 NOTE — Discharge Instructions (Signed)
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
